# Patient Record
Sex: Male | Born: 1965 | Race: White | Hispanic: No | Marital: Married | Smoking: Current every day smoker
Health system: Southern US, Community
[De-identification: ages and names within clinical notes are randomized; demographics above are authoritative.]

## PROBLEM LIST (undated history)

## (undated) DIAGNOSIS — N189 Chronic kidney disease, unspecified: Secondary | ICD-10-CM

## (undated) DIAGNOSIS — K219 Gastro-esophageal reflux disease without esophagitis: Secondary | ICD-10-CM

## (undated) DIAGNOSIS — J45909 Unspecified asthma, uncomplicated: Secondary | ICD-10-CM

## (undated) HISTORY — PX: HERNIA REPAIR: SHX51

## (undated) HISTORY — PX: SEPTOPLASTY: SUR1290

## (undated) HISTORY — PX: CHEST TUBE INSERTION: SHX231

---

## 2009-06-23 ENCOUNTER — Emergency Department: Payer: Self-pay | Admitting: Internal Medicine

## 2009-09-19 ENCOUNTER — Ambulatory Visit: Payer: Self-pay | Admitting: Specialist

## 2010-09-16 ENCOUNTER — Ambulatory Visit: Payer: Self-pay | Admitting: Surgery

## 2012-11-20 ENCOUNTER — Emergency Department: Payer: Self-pay | Admitting: Emergency Medicine

## 2014-07-28 ENCOUNTER — Emergency Department: Payer: Self-pay | Admitting: Emergency Medicine

## 2014-07-28 LAB — URINALYSIS, COMPLETE
Bacteria: NONE SEEN
Bilirubin,UR: NEGATIVE
Glucose,UR: NEGATIVE mg/dL (ref 0–75)
Ketone: NEGATIVE
Leukocyte Esterase: NEGATIVE
Nitrite: NEGATIVE
Ph: 5 (ref 4.5–8.0)
Protein: NEGATIVE
RBC,UR: 7 /HPF (ref 0–5)
Specific Gravity: 1.017 (ref 1.003–1.030)
Squamous Epithelial: NONE SEEN
WBC UR: 3 /HPF (ref 0–5)

## 2014-07-28 LAB — COMPREHENSIVE METABOLIC PANEL
Albumin: 3.9 g/dL (ref 3.4–5.0)
Alkaline Phosphatase: 169 U/L — ABNORMAL HIGH
Anion Gap: 4 — ABNORMAL LOW (ref 7–16)
BUN: 12 mg/dL (ref 7–18)
Bilirubin,Total: 0.4 mg/dL (ref 0.2–1.0)
Calcium, Total: 8.8 mg/dL (ref 8.5–10.1)
Chloride: 107 mmol/L (ref 98–107)
Co2: 25 mmol/L (ref 21–32)
Creatinine: 0.96 mg/dL (ref 0.60–1.30)
EGFR (African American): >60
EGFR (Non-African Amer.): >60
Glucose: 106 mg/dL — ABNORMAL HIGH (ref 65–99)
Osmolality: 272 (ref 275–301)
Potassium: 3.6 mmol/L (ref 3.5–5.1)
SGOT(AST): 35 U/L (ref 15–37)
SGPT (ALT): 36 U/L
Sodium: 136 mmol/L (ref 136–145)
Total Protein: 7.3 g/dL (ref 6.4–8.2)

## 2014-07-28 LAB — CBC
HCT: 47.6 % (ref 40.0–52.0)
HGB: 16.2 g/dL (ref 13.0–18.0)
MCH: 29.9 pg (ref 26.0–34.0)
MCHC: 34 g/dL (ref 32.0–36.0)
MCV: 88 fL (ref 80–100)
Platelet: 230 10*3/uL (ref 150–440)
RBC: 5.41 10*6/uL (ref 4.40–5.90)
RDW: 13.1 % (ref 11.5–14.5)
WBC: 10.6 10*3/uL (ref 3.8–10.6)

## 2015-01-25 ENCOUNTER — Encounter: Payer: Self-pay | Admitting: *Deleted

## 2015-01-28 ENCOUNTER — Encounter: Payer: Self-pay | Admitting: *Deleted

## 2015-01-28 ENCOUNTER — Ambulatory Visit
Admission: RE | Admit: 2015-01-28 | Discharge: 2015-01-28 | Disposition: A | Discharge status: Home / Self Care | Payer: BLUE CROSS/BLUE SHIELD | Source: Ambulatory Visit | Attending: Gastroenterology | Admitting: Gastroenterology

## 2015-01-28 ENCOUNTER — Encounter
Admission: RE | Disposition: A | Discharge status: Home / Self Care | Payer: Self-pay | Source: Ambulatory Visit | Attending: Gastroenterology

## 2015-01-28 ENCOUNTER — Ambulatory Visit: Payer: BLUE CROSS/BLUE SHIELD | Admitting: Anesthesiology

## 2015-01-28 DIAGNOSIS — Z7951 Long term (current) use of inhaled steroids: Secondary | ICD-10-CM | POA: Insufficient documentation

## 2015-01-28 DIAGNOSIS — Z1211 Encounter for screening for malignant neoplasm of colon: Secondary | ICD-10-CM | POA: Diagnosis not present

## 2015-01-28 DIAGNOSIS — Z79899 Other long term (current) drug therapy: Secondary | ICD-10-CM | POA: Insufficient documentation

## 2015-01-28 DIAGNOSIS — N189 Chronic kidney disease, unspecified: Secondary | ICD-10-CM | POA: Diagnosis not present

## 2015-01-28 DIAGNOSIS — F17210 Nicotine dependence, cigarettes, uncomplicated: Secondary | ICD-10-CM | POA: Diagnosis not present

## 2015-01-28 DIAGNOSIS — J45909 Unspecified asthma, uncomplicated: Secondary | ICD-10-CM | POA: Insufficient documentation

## 2015-01-28 DIAGNOSIS — K219 Gastro-esophageal reflux disease without esophagitis: Secondary | ICD-10-CM | POA: Diagnosis not present

## 2015-01-28 HISTORY — DX: Gastro-esophageal reflux disease without esophagitis: K21.9

## 2015-01-28 HISTORY — DX: Chronic kidney disease, unspecified: N18.9

## 2015-01-28 HISTORY — PX: COLONOSCOPY WITH PROPOFOL: SHX5780

## 2015-01-28 HISTORY — DX: Unspecified asthma, uncomplicated: J45.909

## 2015-01-28 SURGERY — COLONOSCOPY WITH PROPOFOL
Anesthesia: General

## 2015-01-28 MED ORDER — PROPOFOL INFUSION 10 MG/ML OPTIME
INTRAVENOUS | Status: DC | PRN
  Administered 2015-01-28: 120 ug/kg/min via INTRAVENOUS

## 2015-01-28 MED ORDER — FENTANYL CITRATE (PF) 100 MCG/2ML IJ SOLN
INTRAMUSCULAR | Status: DC | PRN
  Administered 2015-01-28: 50 ug via INTRAVENOUS

## 2015-01-28 MED ORDER — MIDAZOLAM HCL 2 MG/2ML IJ SOLN
INTRAMUSCULAR | Status: DC | PRN
  Administered 2015-01-28: 1 mg via INTRAVENOUS

## 2015-01-28 MED ORDER — SODIUM CHLORIDE 0.9 % IV SOLN
INTRAVENOUS | Status: DC
  Administered 2015-01-28: 1000 mL via INTRAVENOUS

## 2015-01-28 MED ORDER — PROPOFOL 10 MG/ML IV BOLUS
INTRAVENOUS | Status: DC | PRN
  Administered 2015-01-28: 78 mg via INTRAVENOUS

## 2015-01-28 MED ORDER — SODIUM CHLORIDE 0.9 % IV SOLN: INTRAVENOUS | Status: DC

## 2015-01-28 NOTE — Transfer of Care (Signed)
Immediate Anesthesia Transfer of Care Note  Patient: Leslie Graham  Procedure(s) Performed: Procedure(s): COLONOSCOPY WITH PROPOFOL (N/A)  Patient Location: PACU  Anesthesia Type:General  Level of Consciousness: alert   Airway & Oxygen Therapy: Patient Spontanous Breathing  Post-op Assessment: Report given to RN  Post vital signs: Reviewed  Last Vitals:  Filed Vitals:   01/28/15 0930  BP: 137/80  Pulse: 85  Temp:   Resp: 20    Complications: No apparent anesthesia complications

## 2015-01-28 NOTE — H&P (Signed)
  Primary Care Physician:  Jerl MinaHEDRICK, JAMES, MD  Pre-Procedure History & Physical: HPI:  Leslie HalstedBryan A Schultes is a 49 y.o. male is here for an colonoscopy.   Past Medical History  Diagnosis Date  . Chronic kidney disease   . Asthma   . GERD (gastroesophageal reflux disease)     Past Surgical History  Procedure Laterality Date  . Hernia repair    . Chest tube insertion    . Septoplasty      Prior to Admission medications   Medication Sig Start Date End Date Taking? Authorizing Provider  albuterol (PROVENTIL HFA;VENTOLIN HFA) 108 (90 BASE) MCG/ACT inhaler Inhale 2 puffs into the lungs every 6 (six) hours as needed for wheezing or shortness of breath.   Yes Historical Provider, MD  fexofenadine (ALLEGRA) 180 MG tablet Take 180 mg by mouth daily.   Yes Historical Provider, MD  fluticasone (FLONASE) 50 MCG/ACT nasal spray Place 2 sprays into both nostrils daily.   Yes Historical Provider, MD    Allergies as of 01/16/2015  . (Not on File)    History reviewed. No pertinent family history.  History   Social History  . Marital Status: Married    Spouse Name: N/A  . Number of Children: N/A  . Years of Education: N/A   Occupational History  . Not on file.   Social History Main Topics  . Smoking status: Light Tobacco Smoker    Types: Cigarettes  . Smokeless tobacco: Not on file  . Alcohol Use: Not on file  . Drug Use: Not on file  . Sexual Activity: Not on file   Other Topics Concern  . Not on file   Social History Narrative     Physical Exam: BP 137/100 mmHg  Pulse 97  Temp(Src) 97.3 F (36.3 C) (Tympanic)  Resp 18  Ht 5' 6.5" (1.689 m)  Wt 73.029 kg (161 lb)  BMI 25.60 kg/m2 General:   Alert,  pleasant and cooperative in NAD Head:  Normocephalic and atraumatic. Neck:  Supple; no masses or thyromegaly. Lungs:  Clear throughout to auscultation.    Heart:  Regular rate and rhythm. Abdomen:  Soft, nontender and nondistended. Normal bowel sounds, without guarding,  and without rebound.   Neurologic:  Alert and  oriented x4;  grossly normal neurologically.  Impression/Plan: Leslie Graham is here for an colonoscopy to be performed for screening  Risks, benefits, limitations, and alternatives regarding  colonoscopy have been reviewed with the patient.  Questions have been answered.  All parties agreeable.   Elnita MaxwellEIN, MATTHEW GORDON, MD  01/28/2015, 8:52 AM

## 2015-01-28 NOTE — Anesthesia Postprocedure Evaluation (Signed)
  Anesthesia Post-op Note  Patient: Leslie Graham  Procedure(s) Performed: Procedure(s): COLONOSCOPY WITH PROPOFOL (N/A)  Anesthesia type:General  Patient location: PACU  Post pain: Pain level controlled  Post assessment: Post-op Vital signs reviewed, Patient's Cardiovascular Status Stable, Respiratory Function Stable, Patent Airway and No signs of Nausea or vomiting  Post vital signs: Reviewed and stable  Last Vitals:  Filed Vitals:   01/28/15 0930  BP: 137/80  Pulse: 85  Temp:   Resp: 20    Level of consciousness: awake, alert  and patient cooperative  Complications: No apparent anesthesia complications

## 2015-01-28 NOTE — Op Note (Signed)
[facility] Patient Name: Nalin Mazzocco Procedure Date: 01/28/2015 8:55 AM MRN: [id number] Account #: [id number] Date of Birth: 04-Jun-1966 Admit Type: Outpatient Age: 49 Room: [facility] ENDO ROOM 4 Gender: Male Note Status: Finalized Procedure:         Colonoscopy Indications:       Screening for colorectal malignant neoplasm, This is the                     patient's first colonoscopy Patient Profile:   This is a 49 year old male. Providers:         Rhona Raider. Shelle Iron, MD Referring MD:      Rhona Leavens. Burnett Sheng, MD (Referring MD) Medicines:         Propofol per Anesthesia Complications:     No immediate complications. Procedure:         Pre-Anesthesia Assessment:                    - Prior to the procedure, a History and Physical was                     performed, and patient medications, allergies and                     sensitivities were reviewed. The patient's tolerance of                     previous anesthesia was reviewed.                    After obtaining informed consent, the colonoscope was                     passed under direct vision. Throughout the procedure, the                     patient's blood pressure, pulse, and oxygen saturations                     were monitored continuously. The Olympus PCF-H180AL                     colonoscope ( S#: [id number] ) was introduced through the                     anus and advanced to the the cecum, identified by                     appendiceal orifice and ileocecal valve. The colonoscopy                     was somewhat difficult due to a tortuous colon. Successful                     completion of the procedure was aided by withdrawing the                     scope and replacing with the pediatric colonoscope. The                     patient tolerated the procedure well. The quality of the                     bowel preparation was excellent. Findings:      The perianal and  digital rectal examinations  were normal.      The entire examined colon appeared normal on direct and retroflexion       views. Impression:        - The entire examined colon is normal on direct and                     retroflexion views.                    - No specimens collected. Recommendation:    - Observe patient in GI recovery unit.                    - Continue present medications.                    - Repeat colonoscopy in 10 years for screening purposes.                    - Return to referring physician.                    - The findings and recommendations were discussed with the                     patient.                    - The findings and recommendations were discussed with the                     patient's family. Procedure Code(s): --- Professional ---                    [id number], Colonoscopy, flexible; diagnostic, including                     collection of specimen(s) by brushing or washing, when                     performed (separate procedure) CPT copyright 2014 [employer]. All rights reserved. The codes documented in this report are preliminary and upon coder review may  be revised to meet current compliance requirements. Kathalene FramesMatthew G Shaylea Ucci, MD 01/28/2015 9:27:18 AM This report has been signed electronically. Number of Addenda: 0 Note Initiated On: 01/28/2015 8:55 AM Scope Withdrawal Time: 0 hours 11 minutes 41 seconds  Total Procedure Duration: 0 hours 22 minutes 50 seconds       [facility]

## 2015-01-28 NOTE — Discharge Instructions (Signed)

## 2015-01-28 NOTE — Anesthesia Preprocedure Evaluation (Signed)
Anesthesia Evaluation  Patient identified by MRN, date of birth, ID band Patient awake    Reviewed: Allergy & Precautions, NPO status , Patient's Chart, lab work & pertinent test results  History of Anesthesia Complications Negative for: history of anesthetic complications  Airway Mallampati: II  TM Distance: >3 FB Neck ROM: Full    Dental  (+) Teeth Intact   Pulmonary asthma (last inhaler 3 weeks ago) , Current Smoker (1/2 ppd),          Cardiovascular     Neuro/Psych    GI/Hepatic GERD- (OTC meds prn)  Medicated,  Endo/Other    Renal/GU Renal InsufficiencyRenal disease     Musculoskeletal   Abdominal   Peds  Hematology   Anesthesia Other Findings   Reproductive/Obstetrics                             Anesthesia Physical Anesthesia Plan  ASA: II  Anesthesia Plan: General   Post-op Pain Management:    Induction: Intravenous  Airway Management Planned: Nasal Cannula  Additional Equipment:   Intra-op Plan:   Post-operative Plan:   Informed Consent: I have reviewed the patients History and Physical, chart, labs and discussed the procedure including the risks, benefits and alternatives for the proposed anesthesia with the patient or authorized representative who has indicated his/her understanding and acceptance.     Plan Discussed with:   Anesthesia Plan Comments:         Anesthesia Quick Evaluation

## 2015-01-29 ENCOUNTER — Encounter: Payer: Self-pay | Admitting: Gastroenterology

## 2019-01-17 ENCOUNTER — Other Ambulatory Visit: Payer: Self-pay | Admitting: Family Medicine

## 2019-01-17 DIAGNOSIS — R1032 Left lower quadrant pain: Secondary | ICD-10-CM

## 2019-01-25 ENCOUNTER — Other Ambulatory Visit: Payer: Self-pay

## 2019-01-25 ENCOUNTER — Ambulatory Visit
Admission: RE | Admit: 2019-01-25 | Discharge: 2019-01-25 | Disposition: A | Discharge status: Home / Self Care | Payer: BC Managed Care – PPO | Source: Ambulatory Visit | Attending: Family Medicine | Admitting: Family Medicine

## 2019-01-25 DIAGNOSIS — R1032 Left lower quadrant pain: Secondary | ICD-10-CM | POA: Diagnosis present

## 2019-01-25 MED ORDER — IOHEXOL 300 MG/ML  SOLN
100.0000 mL | Freq: Once | INTRAMUSCULAR | Status: AC | PRN
  Administered 2019-01-25: 100 mL via INTRAVENOUS

## 2019-06-09 ENCOUNTER — Other Ambulatory Visit: Payer: Self-pay

## 2019-06-09 ENCOUNTER — Ambulatory Visit
Admission: EM | Admit: 2019-06-09 | Discharge: 2019-06-09 | Disposition: A | Discharge status: Home / Self Care | Payer: BC Managed Care – PPO | Attending: Emergency Medicine | Admitting: Emergency Medicine

## 2019-06-09 ENCOUNTER — Encounter: Payer: Self-pay | Admitting: Emergency Medicine

## 2019-06-09 DIAGNOSIS — Z20828 Contact with and (suspected) exposure to other viral communicable diseases: Secondary | ICD-10-CM | POA: Diagnosis not present

## 2019-06-09 DIAGNOSIS — Z0189 Encounter for other specified special examinations: Secondary | ICD-10-CM

## 2019-06-09 NOTE — ED Provider Notes (Signed)
Roderic Palau    CSN: [id number] Arrival date & time: 06/09/19  1528      History   Chief Complaint Chief Complaint  Patient presents with  . Cvid test    HPI Leslie Graham is a 53 y.o. male.   Patient presents with request for a COVID test today.  He was exposed at home by his COVID positive grandson who lives with him.  He is currently asymptomatic and denies fever, chills, congestion, rhinorrhea, cough, shortness of breath, vomiting, diarrhea, rash, or other symptoms.  No treatments attempted at home.  The history is provided by the patient.    Past Medical History:  Diagnosis Date  . Asthma   . Chronic kidney disease   . GERD (gastroesophageal reflux disease)     There are no active problems to display for this patient.   Past Surgical History:  Procedure Laterality Date  . CHEST TUBE INSERTION    . COLONOSCOPY WITH PROPOFOL N/A 01/28/2015   Procedure: COLONOSCOPY WITH PROPOFOL;  Surgeon: Josefine Class, MD;  Location: [facility] ENDOSCOPY;  Service: Endoscopy;  Laterality: N/A;  . HERNIA REPAIR    . SEPTOPLASTY         Home Medications    Prior to Admission medications   Medication Sig Start Date End Date Taking? Authorizing Provider  albuterol (PROVENTIL HFA;VENTOLIN HFA) 108 (90 BASE) MCG/ACT inhaler Inhale 2 puffs into the lungs every 6 (six) hours as needed for wheezing or shortness of breath.   Yes [provider]  fexofenadine (ALLEGRA) 180 MG tablet Take 180 mg by mouth daily.   Yes [provider]  fluticasone (FLONASE) 50 MCG/ACT nasal spray Place 2 sprays into both nostrils daily.   Yes [provider]    Family History History reviewed. No pertinent family history.  Social History Social History   Tobacco Use  . Smoking status: Light Tobacco Smoker    Types: Cigarettes  . Smokeless tobacco: Never Used  Substance Use Topics  . Alcohol use: Yes  . Drug use: Not on file     Allergies   Augmentin  [amoxicillin-pot clavulanate]   Review of Systems Review of Systems  Constitutional: Negative for chills and fever.  HENT: Negative for congestion, ear pain, rhinorrhea and sore throat.   Eyes: Negative for pain and visual disturbance.  Respiratory: Negative for cough and shortness of breath.   Cardiovascular: Negative for chest pain and palpitations.  Gastrointestinal: Negative for abdominal pain, diarrhea, nausea and vomiting.  Genitourinary: Negative for dysuria and hematuria.  Musculoskeletal: Negative for arthralgias and back pain.  Skin: Negative for color change and rash.  Neurological: Negative for seizures and syncope.  All other systems reviewed and are negative.    Physical Exam Triage Vital Signs ED Triage Vitals  Enc Vitals Group     BP      Pulse      Resp      Temp      Temp src      SpO2      Weight      Height      Head Circumference      Peak Flow      Pain Score      Pain Loc      Pain Edu?      Excl. in Normal?    No data found.  Updated Vital Signs BP (!) 145/89   Pulse 79   Temp 98.3 F (36.8 C) (Oral)  Resp 18   Wt 155 lb (70.3 kg)   SpO2 95%   BMI 24.64 kg/m   Visual Acuity Right Eye Distance:   Left Eye Distance:   Bilateral Distance:    Right Eye Near:   Left Eye Near:    Bilateral Near:     Physical Exam Vitals signs and nursing note reviewed.  Constitutional:      General: He is not in acute distress.    Appearance: He is well-developed. He is not ill-appearing.  HENT:     Head: Normocephalic and atraumatic.     Right Ear: Tympanic membrane normal.     Left Ear: Tympanic membrane normal.     Nose: Nose normal.     Mouth/Throat:     Mouth: Mucous membranes are moist.     Pharynx: Oropharynx is clear.  Eyes:     Conjunctiva/sclera: Conjunctivae normal.  Neck:     Musculoskeletal: Neck supple.  Cardiovascular:     Rate and Rhythm: Normal rate and regular rhythm.     Heart sounds: No murmur.  Pulmonary:     Effort:  Pulmonary effort is normal. No respiratory distress.     Breath sounds: Normal breath sounds.  Abdominal:     General: Bowel sounds are normal.     Palpations: Abdomen is soft.     Tenderness: There is no abdominal tenderness. There is no guarding or rebound.  Skin:    General: Skin is warm and dry.     Findings: No rash.  Neurological:     General: No focal deficit present.     Mental Status: He is alert and oriented to person, place, and time.      UC Treatments / Results  Labs (all labs ordered are listed, but only abnormal results are displayed) Labs Reviewed  NOVEL CORONAVIRUS, NAA    EKG   Radiology No results found.  Procedures Procedures (including critical care time)  Medications Ordered in UC Medications - No data to display  Initial Impression / Assessment and Plan / UC Course  I have reviewed the triage vital signs and the nursing notes.  Pertinent labs & imaging results that were available during my care of the patient were reviewed by me and considered in my medical decision making (see chart for details).    Patient request for COVID test due to exposure at home.  Patient is well-appearing and his exam is unremarkable.  COVID test performed here.  Instructed patient to self quarantine until the test result is back.  Instructed patient to go to the emergency department if he develops high fever, shortness of breath, severe diarrhea, or other concerning symptoms.  Patient agrees with plan of care.     Final Clinical Impressions(s) / UC Diagnoses   Final diagnoses:  Patient request for diagnostic testing     Discharge Instructions     Your COVID test is pending.  You should self quarantine until your test result is back and is negative.    Go to the emergency department if you develop high fever, shortness of breath, severe diarrhea, or other concerning symptoms.       ED Prescriptions    None     PDMP not reviewed this encounter.   Mickie Bail, NP 06/09/19 [phone]

## 2019-06-09 NOTE — Discharge Instructions (Addendum)
Your COVID test is pending.  You should self quarantine until your test result is back and is negative.   ° °Go to the emergency department if you develop high fever, shortness of breath, severe diarrhea, or other concerning symptoms.   ° °

## 2019-06-12 LAB — NOVEL CORONAVIRUS, NAA: SARS-CoV-2, NAA: NOT DETECTED

## 2020-04-19 IMAGING — CT CT ABDOMEN AND PELVIS WITH CONTRAST
2 of 5 series · 16 of 46 positions shown, 18 images · IV contrast (omnipaque)
Comparison: 07/28/2014

CLINICAL DATA: Left lower quadrant pain for several weeks

EXAM:
CT ABDOMEN AND PELVIS WITH CONTRAST
TECHNIQUE: Multidetector CT imaging of the abdomen and pelvis was performed
using the standard protocol following bolus administration of
intravenous contrast.
CONTRAST:  100mL OMNIPAQUE IOHEXOL 300 MG/ML  SOLN

[Series 2: abd pelvis · axial · 0.74mm/px · z∈[-1511,-1131]mm · 13 of 86 slices shown, 15 images]
[im 5/86  soft-tissue]
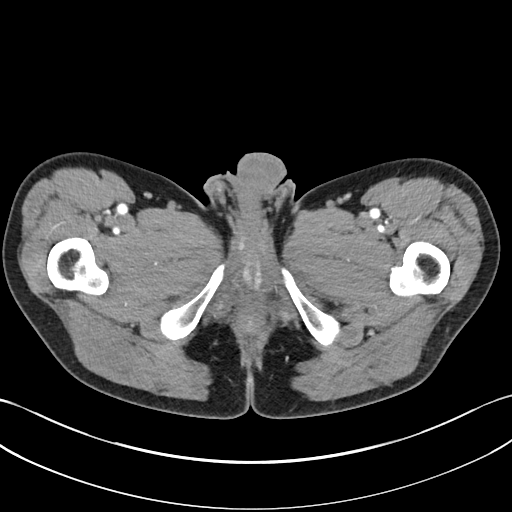
[im 5/86  bone]
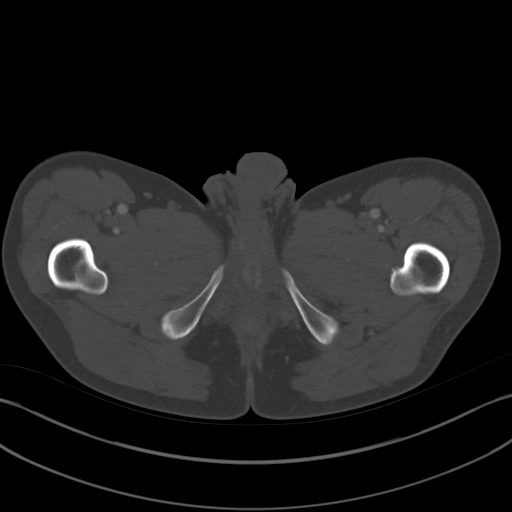
[im 10/86  soft-tissue]
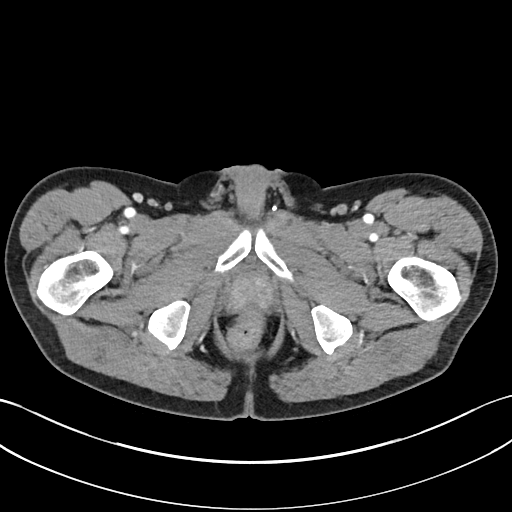
[im 19/86  soft-tissue]
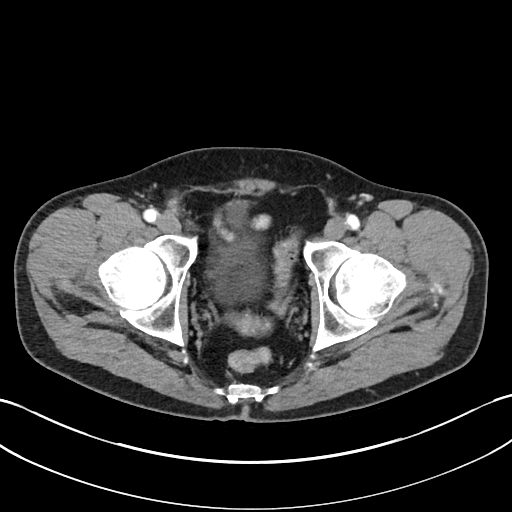
[im 24/86  soft-tissue]
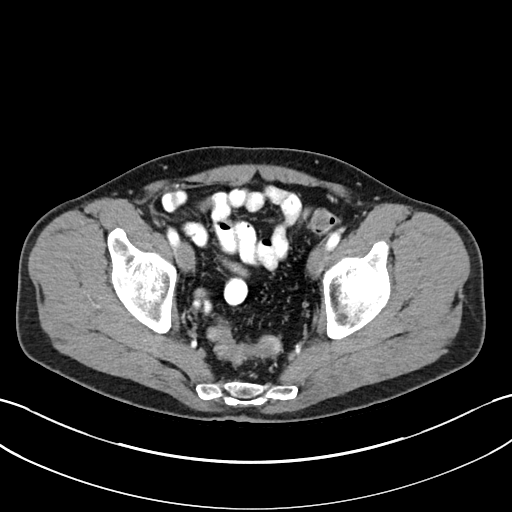
[im 29/86  soft-tissue]
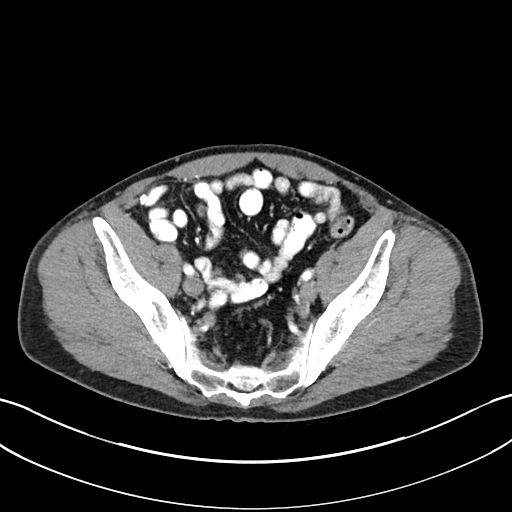
[im 38/86  soft-tissue]
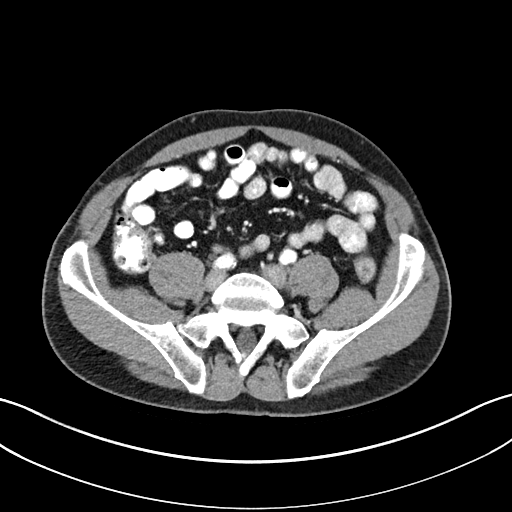
[im 43/86  soft-tissue]
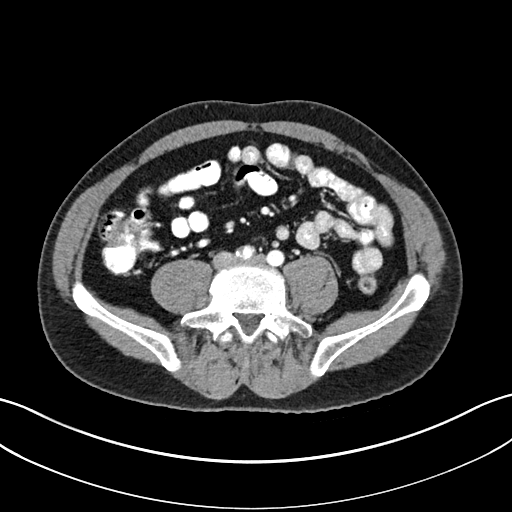
[im 48/86  soft-tissue]
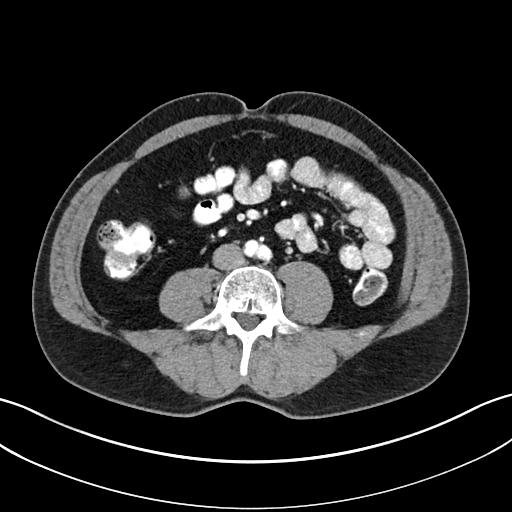
[im 57/86  soft-tissue]
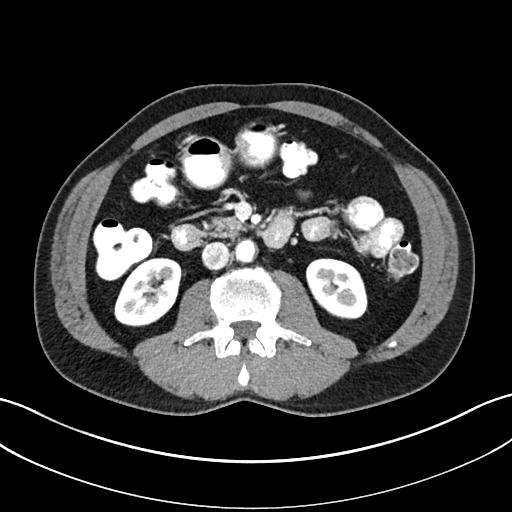
[im 57/86  bone]
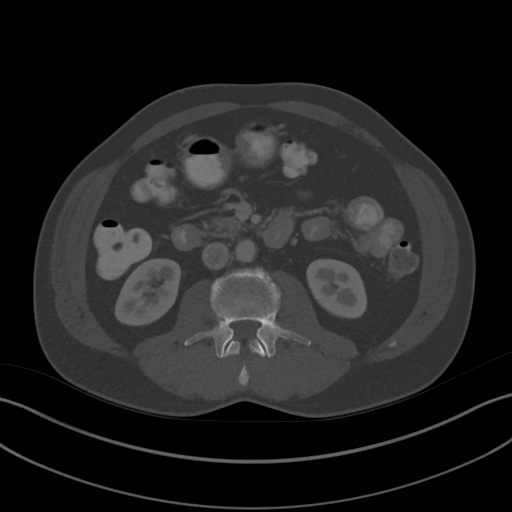
[im 62/86  soft-tissue]
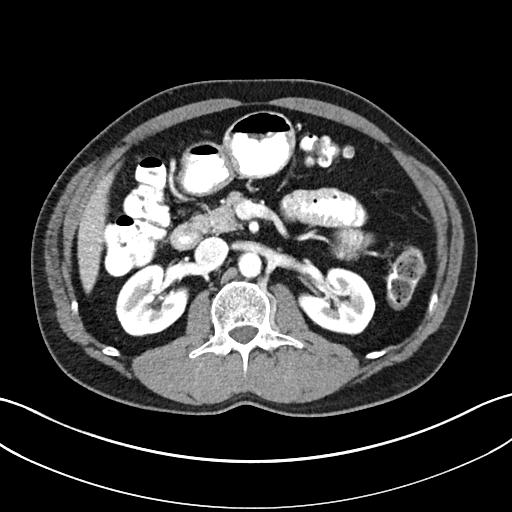
[im 67/86  soft-tissue]
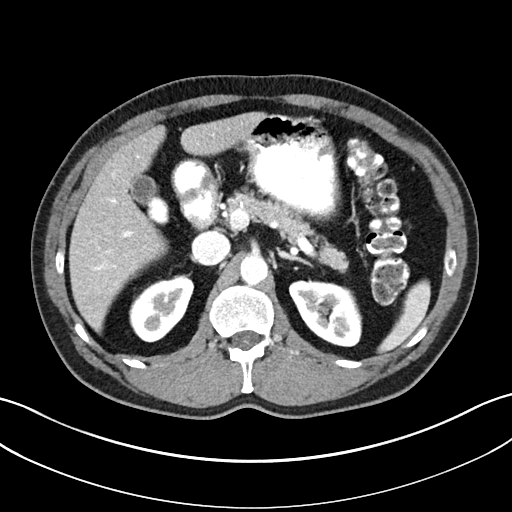
[im 76/86  soft-tissue]
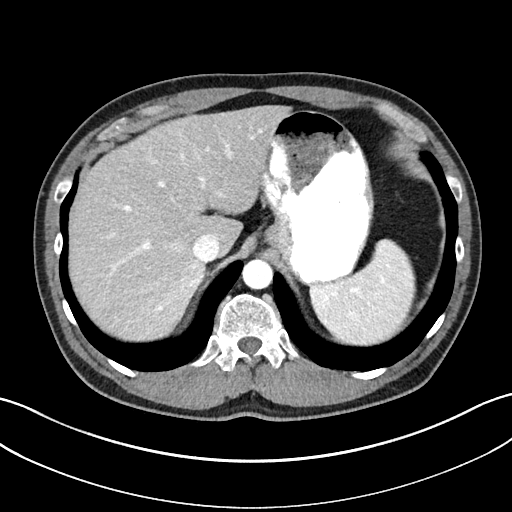
[im 81/86  soft-tissue]
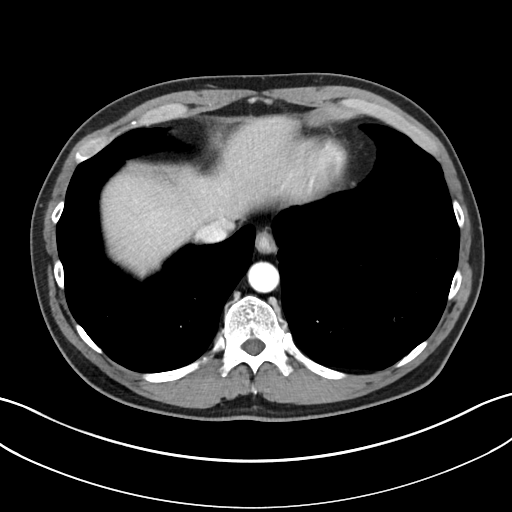

[Series 4: coronal abd pelvis · coronal · 0.74mm/px · 3 of 133 slices shown]
[im 45/133  soft-tissue]
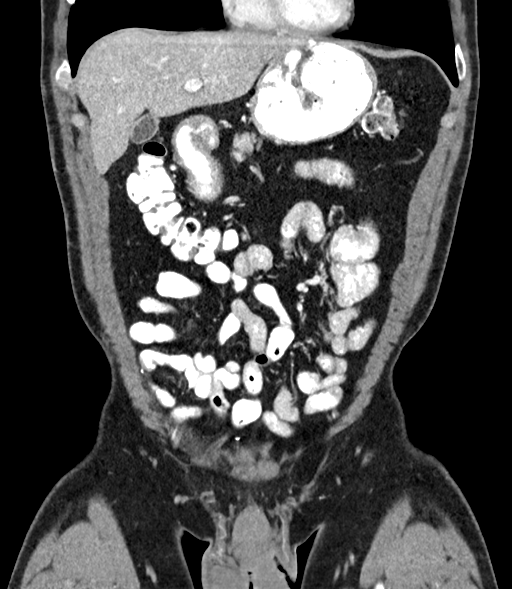
[im 59/133  soft-tissue]
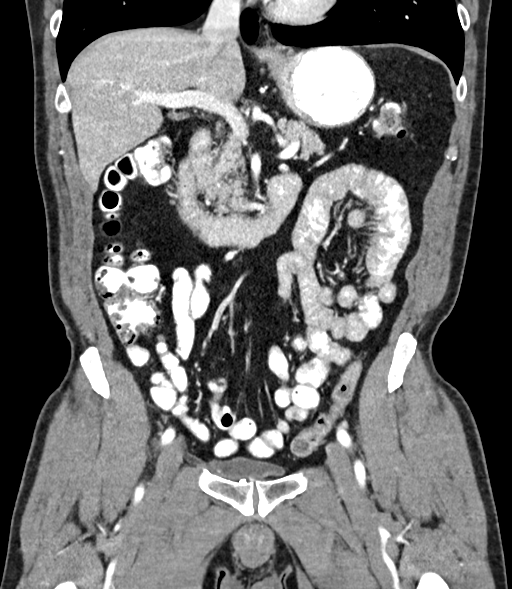
[im 74/133  soft-tissue]
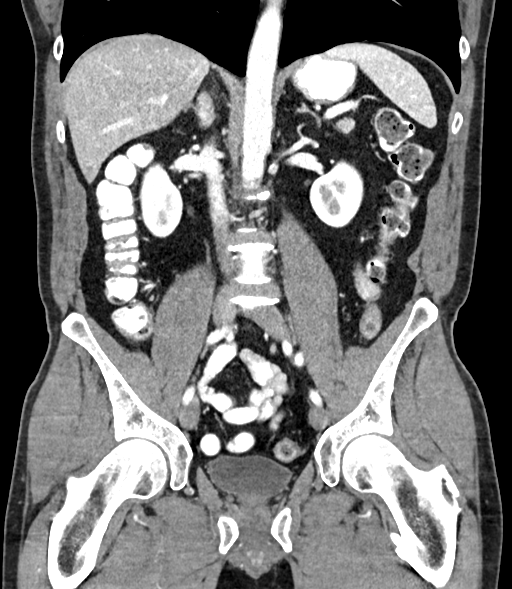

[16 of 46 positions shown; findings below may reference images not displayed]

FINDINGS: Lower chest: No acute abnormality.

Hepatobiliary: Fatty infiltration of the liver is noted. The
gallbladder is partially decompressed.

Pancreas: Unremarkable. No pancreatic ductal dilatation or
surrounding inflammatory changes.

Spleen: Normal in size without focal abnormality.

Adrenals/Urinary Tract: Adrenal glands are within normal limits.
Kidneys are well visualized bilaterally without renal calculi or
obstructive changes. The ureters are within normal limits
bilaterally. Bladder is well distended.

Stomach/Bowel: The appendix is within normal limits. No obstructive
or inflammatory changes of the colon or small bowel are seen. No
gastric abnormality is noted.

Vascular/Lymphatic: Aortic atherosclerosis. No enlarged abdominal or
pelvic lymph nodes.

Reproductive: Prostate is unremarkable.

Other: No abdominal wall hernia or abnormality. No abdominopelvic
ascites.

Musculoskeletal: No acute or significant osseous findings.
IMPRESSION: Fatty liver.

No other focal abnormality is noted.

## 2020-06-23 ENCOUNTER — Emergency Department
Admission: EM | Admit: 2020-06-23 | Discharge: 2020-06-23 | Disposition: A | Discharge status: Home / Self Care | Payer: BC Managed Care – PPO | Attending: Emergency Medicine | Admitting: Emergency Medicine

## 2020-06-23 ENCOUNTER — Encounter: Payer: Self-pay | Admitting: Emergency Medicine

## 2020-06-23 ENCOUNTER — Emergency Department: Payer: BC Managed Care – PPO

## 2020-06-23 ENCOUNTER — Other Ambulatory Visit: Payer: Self-pay

## 2020-06-23 DIAGNOSIS — R079 Chest pain, unspecified: Secondary | ICD-10-CM | POA: Diagnosis present

## 2020-06-23 DIAGNOSIS — Z7951 Long term (current) use of inhaled steroids: Secondary | ICD-10-CM | POA: Diagnosis not present

## 2020-06-23 DIAGNOSIS — J45909 Unspecified asthma, uncomplicated: Secondary | ICD-10-CM | POA: Insufficient documentation

## 2020-06-23 DIAGNOSIS — R0789 Other chest pain: Secondary | ICD-10-CM | POA: Diagnosis not present

## 2020-06-23 DIAGNOSIS — F17210 Nicotine dependence, cigarettes, uncomplicated: Secondary | ICD-10-CM | POA: Diagnosis not present

## 2020-06-23 DIAGNOSIS — N189 Chronic kidney disease, unspecified: Secondary | ICD-10-CM | POA: Insufficient documentation

## 2020-06-23 LAB — BASIC METABOLIC PANEL
Anion gap: 7 (ref 5–15)
BUN: 10 mg/dL (ref 6–20)
CO2: 27 mmol/L (ref 22–32)
Calcium: 9.4 mg/dL (ref 8.9–10.3)
Chloride: 104 mmol/L (ref 98–111)
Creatinine, Ser: 0.88 mg/dL (ref 0.61–1.24)
GFR, Estimated: >60 mL/min (ref >60)
Glucose, Bld: 100 mg/dL — ABNORMAL HIGH (ref 70–99)
Potassium: 4.3 mmol/L (ref 3.5–5.1)
Sodium: 138 mmol/L (ref 135–145)

## 2020-06-23 LAB — CBC
HCT: 48.9 % (ref 39.0–52.0)
Hemoglobin: 16.8 g/dL (ref 13.0–17.0)
MCH: 30.1 pg (ref 26.0–34.0)
MCHC: 34.4 g/dL (ref 30.0–36.0)
MCV: 87.5 fL (ref 80.0–100.0)
Platelets: 216 10*3/uL (ref 150–400)
RBC: 5.59 MIL/uL (ref 4.22–5.81)
RDW: 12.7 % (ref 11.5–15.5)
WBC: 8.1 10*3/uL (ref 4.0–10.5)
nRBC: 0 % (ref 0.0–0.2)

## 2020-06-23 LAB — TROPONIN I (HIGH SENSITIVITY)
Troponin I (High Sensitivity): 4 ng/L (ref <18)
Troponin I (High Sensitivity): 5 ng/L (ref <18)

## 2020-06-23 MED ORDER — KETOROLAC TROMETHAMINE 60 MG/2ML IM SOLN
30.0000 mg | Freq: Once | INTRAMUSCULAR | Status: DC
  Filled 2020-06-23: qty 2

## 2020-06-23 MED ORDER — MELOXICAM 15 MG PO TABS: 15.0000 mg | ORAL_TABLET | Freq: Every day | ORAL | 0 refills | Status: AC
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
## 2020-06-23 NOTE — Discharge Instructions (Signed)
Please call and schedule an appointment with cardiology.  Return to the ER if pain increases or you develop new symptoms of concern.
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
## 2020-06-27 ENCOUNTER — Encounter: Payer: Self-pay | Admitting: Cardiology

## 2020-06-27 ENCOUNTER — Other Ambulatory Visit: Payer: Self-pay

## 2020-06-27 ENCOUNTER — Ambulatory Visit (INDEPENDENT_AMBULATORY_CARE_PROVIDER_SITE_OTHER): Payer: BC Managed Care – PPO | Admitting: Cardiology

## 2020-06-27 VITALS — BP 120/90 | HR 69 | Ht 66.5 in | Wt 152.0 lb

## 2020-06-27 DIAGNOSIS — R079 Chest pain, unspecified: Secondary | ICD-10-CM

## 2020-06-27 DIAGNOSIS — F17200 Nicotine dependence, unspecified, uncomplicated: Secondary | ICD-10-CM

## 2020-06-27 NOTE — Progress Notes (Signed)
Cardiology Office Note:    Date:  06/27/2020   ID:  Leslie Graham, DOB 02/21/1966, MRN [id number]  PCP:  Jerl Mina, MD  [facility] Cardiologist:  Debbe Odea, MD  [facility] Electrophysiologist:  None   Referring MD: Jerl Mina, MD   Chief Complaint  Patient presents with  . NEW patient-ED F/U for evaluation of chest and left arm pain   Leslie Graham is a 54 y.o. male who is being seen today for the evaluation of chest pain at the request of Jerl Mina, MD.   History of Present Illness:    Leslie Graham is a 54 y.o. male with a hx of GERD, current smoker x20 years who presents due to chest pain.  Patient states having chest discomfort for about 1 week now.  Pain initially started as back/ left scapular pain which subsequently moved to his left arm and left chest area.  Putting his left arm behind his head makes pains better, putting his arm down next to his hip makes pain worse.  Walking or exertion has no relation with pain.  Denies shortness of breath.    He went to an urgent care and was referred to the ED due to chest pain symptoms. Seen in the ED on 06/23/2020 where EKG did not show acute ischemia, troponins were normal.  He was started on meloxicam for musculoskeletal pain.  Followed up with orthopedic surgery, where muscle relaxant was prescribed.  He states his symptoms have stayed about the same.  Denies any history of heart disease.  Past Medical History:  Diagnosis Date  . Asthma   . Chronic kidney disease   . GERD (gastroesophageal reflux disease)     Past Surgical History:  Procedure Laterality Date  . CHEST TUBE INSERTION    . COLONOSCOPY WITH PROPOFOL N/A 01/28/2015   Procedure: COLONOSCOPY WITH PROPOFOL;  Surgeon: Elnita Maxwell, MD;  Location: [facility] ENDOSCOPY;  Service: Endoscopy;  Laterality: N/A;  . HERNIA REPAIR    . SEPTOPLASTY      Current Medications: Current Meds  Medication Sig  . albuterol (PROVENTIL  HFA;VENTOLIN HFA) 108 (90 BASE) MCG/ACT inhaler Inhale 2 puffs into the lungs every 6 (six) hours as needed for wheezing or shortness of breath.  . cyclobenzaprine (FLEXERIL) 10 MG tablet Take 10 mg by mouth at bedtime.  . fexofenadine (ALLEGRA) 180 MG tablet Take 180 mg by mouth daily.  . fluticasone (FLONASE) 50 MCG/ACT nasal spray Place 2 sprays into both nostrils daily.  . meloxicam (MOBIC) 15 MG tablet Take 1 tablet (15 mg total) by mouth daily.     Allergies:   Amoxicillin-pot clavulanate and Guaifenesin   Social History   Socioeconomic History  . Marital status: Married    Spouse name: Not on file  . Number of children: Not on file  . Years of education: Not on file  . Highest education level: Not on file  Occupational History  . Not on file  Tobacco Use  . Smoking status: Current Every Day Smoker    Packs/day: 1.00    Types: Cigarettes  . Smokeless tobacco: Never Used  Vaping Use  . Vaping Use: Never used  Substance and Sexual Activity  . Alcohol use: Yes    Comment: occassionally  . Drug use: Not Currently    Comment: 40 years ago  . Sexual activity: Not on file  Other Topics Concern  . Not on file  Social History Narrative  . Not on file  Social Determinants of Health   Financial Resource Strain: Not on file  Food Insecurity: Not on file  Transportation Needs: Not on file  Physical Activity: Not on file  Stress: Not on file  Social Connections: Not on file     Family History: The patient's family history includes Hypertension in his father and mother.  ROS:   Please see the history of present illness.     All other systems reviewed and are negative.  EKGs/Labs/Other Studies Reviewed:    The following studies were reviewed today:   EKG:  EKG is  ordered today.  The ekg ordered today demonstrates normal sinus rhythm, nonspecific T wave abnormalities.  Recent Labs: 06/23/2020: BUN 10; Creatinine, Ser 0.88; Hemoglobin 16.8; Platelets 216; Potassium  4.3; Sodium 138  Recent Lipid Panel No results found for: CHOL, TRIG, HDL, CHOLHDL, VLDL, LDLCALC, LDLDIRECT   Risk Assessment/Calculations:      Physical Exam:    VS:  BP 120/90 (BP Location: Right Arm, Patient Position: Sitting, Cuff Size: Normal)   Pulse 69   Ht 5' 6.5" (1.689 m)   Wt 152 lb (68.9 kg)   SpO2 97%   BMI 24.17 kg/m     Wt Readings from Last 3 Encounters:  06/27/20 152 lb (68.9 kg)  06/23/20 153 lb (69.4 kg)  06/09/19 155 lb (70.3 kg)     GEN:  Well nourished, well developed in no acute distress HEENT: Normal NECK: No JVD; No carotid bruits LYMPHATICS: No lymphadenopathy CARDIAC: RRR, no murmurs, rubs, gallops RESPIRATORY:  Clear to auscultation without rales, wheezing or rhonchi  ABDOMEN: Soft, non-tender, non-distended MUSCULOSKELETAL:  No edema; tenderness to palpation of left scapula. SKIN: Warm and dry NEUROLOGIC:  Alert and oriented x 3 PSYCHIATRIC:  Normal affect   ASSESSMENT:    1. Chest pain of uncertain etiology   2. Smoking    PLAN:    In order of problems listed above:  1. Patient with chest pain, made worse with arm movements.  Tender to palpation of left scapula.  Origin of pain noncardiac, likely musculoskeletal in etiology.  Patient made aware of cardiac signs/symptoms of chest discomfort.  If this were to occur, additional testing may be pursued.  Cardiac testing not indicated at this time. 2. Patient is a current smoker, cessation advised.  Over 5 minutes spent counseling patient.  Follow-up as needed    Shared Decision Making/Informed Consent       Medication Adjustments/Labs and Tests Ordered: Current medicines are reviewed at length with the patient today.  Concerns regarding medicines are outlined above.  Orders Placed This Encounter  Procedures  . EKG 12-Lead   No orders of the defined types were placed in this encounter.   Patient Instructions  Medication Instructions:  Your physician recommends that you  continue on your current medications as directed. Please refer to the Current Medication list given to you today.  *If you need a refill on your cardiac medications before your next appointment, please call your pharmacy*  Follow-Up: At [facility], you and your health needs are our priority.  As part of our continuing mission to provide you with exceptional heart care, we have created designated Provider Care Teams.  These Care Teams include your primary Cardiologist (physician) and Advanced Practice Providers (APPs -  Physician Assistants and Nurse Practitioners) who all work together to provide you with the care you need, when you need it.  We recommend signing up for the patient portal called "MyChart".  Sign up  information is provided on this After Visit Summary.  MyChart is used to connect with patients for Virtual Visits (Telemedicine).  Patients are able to view lab/test results, encounter notes, upcoming appointments, etc.  Non-urgent messages can be sent to your provider as well.   To learn more about what you can do with MyChart, go to [email or web address].    Your next appointment:   As needed.  The format for your next appointment:   In Person  Provider:   You may see Debbe Odea, MD or one of the following Advanced Practice Providers on your designated Care Team:    Nicolasa Ducking, NP  Eula Listen, PA-C  Marisue Ivan, PA-C  Cadence [place], [place]  Gillian Shields, NP     Signed, Debbe Odea, MD  06/27/2020 12:54 PM     [facility]

## 2020-06-27 NOTE — Patient Instructions (Signed)
Medication Instructions:  Your physician recommends that you continue on your current medications as directed. Please refer to the Current Medication list given to you today.  *If you need a refill on your cardiac medications before your next appointment, please call your pharmacy*   Follow-Up: At [facility], you and your health needs are our priority.  As part of our continuing mission to provide you with exceptional heart care, we have created designated Provider Care Teams.  These Care Teams include your primary Cardiologist (physician) and Advanced Practice Providers (APPs -  Physician Assistants and Nurse Practitioners) who all work together to provide you with the care you need, when you need it.  We recommend signing up for the patient portal called "MyChart".  Sign up information is provided on this After Visit Summary.  MyChart is used to connect with patients for Virtual Visits (Telemedicine).  Patients are able to view lab/test results, encounter notes, upcoming appointments, etc.  Non-urgent messages can be sent to your provider as well.   To learn more about what you can do with MyChart, go to [email or web address].    Your next appointment:   As needed  The format for your next appointment:   In Person  Provider:   You may see Brian Agbor-Etang, MD or one of the following Advanced Practice Providers on your designated Care Team:    Christopher Berge, NP  Ryan Dunn, PA-C  Jacquelyn Visser, PA-C  Cadence Furth, PA-C  Caitlin Walker, NP    
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
## 2023-11-08 ENCOUNTER — Ambulatory Visit (INDEPENDENT_AMBULATORY_CARE_PROVIDER_SITE_OTHER): Payer: Self-pay

## 2023-11-08 DIAGNOSIS — K219 Gastro-esophageal reflux disease without esophagitis: Secondary | ICD-10-CM | POA: Diagnosis not present

## 2023-11-08 DIAGNOSIS — K449 Diaphragmatic hernia without obstruction or gangrene: Secondary | ICD-10-CM | POA: Diagnosis not present

## 2023-11-08 DIAGNOSIS — R1310 Dysphagia, unspecified: Secondary | ICD-10-CM | POA: Diagnosis present
